# Patient Record
Sex: Male | Born: 2000 | Race: Black or African American | Hispanic: No | Marital: Single | Smoking: Never smoker
Health system: Southern US, Community
[De-identification: ages and names within clinical notes are randomized; demographics above are authoritative.]

## PROBLEM LIST (undated history)

## (undated) DIAGNOSIS — B354 Tinea corporis: Secondary | ICD-10-CM

## (undated) DIAGNOSIS — D571 Sickle-cell disease without crisis: Secondary | ICD-10-CM

## (undated) DIAGNOSIS — D5700 Hb-SS disease with crisis, unspecified: Secondary | ICD-10-CM

## (undated) DIAGNOSIS — Z91010 Allergy to peanuts: Secondary | ICD-10-CM

## (undated) HISTORY — DX: Hb-SS disease with crisis, unspecified: D57.00

## (undated) HISTORY — DX: Tinea corporis: B35.4

## (undated) HISTORY — DX: Sickle-cell disease without crisis: D57.1

## (undated) HISTORY — PX: PORT-A-CATH REMOVAL: SHX5289

## (undated) HISTORY — PX: LIMBAL STEM CELL TRANSPLANT: SHX1969

## (undated) HISTORY — DX: Allergy to peanuts: Z91.010

---
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
## 2022-08-13 ENCOUNTER — Ambulatory Visit
Admission: EM | Admit: 2022-08-13 | Discharge: 2022-08-13 | Disposition: A | Discharge status: Home / Self Care | Payer: BC Managed Care – PPO | Attending: Physician Assistant | Admitting: Physician Assistant

## 2022-08-13 DIAGNOSIS — H5711 Ocular pain, right eye: Secondary | ICD-10-CM

## 2022-08-13 DIAGNOSIS — T1591XA Foreign body on external eye, part unspecified, right eye, initial encounter: Secondary | ICD-10-CM

## 2022-08-13 MED ORDER — POLYMYXIN B-TRIMETHOPRIM 10000-0.1 UNIT/ML-% OP SOLN: 1.0000 [drp] | OPHTHALMIC | 0 refills | Status: AC

## 2022-08-13 NOTE — ED Provider Notes (Signed)
[facility]    CSN: [id number] Arrival date & time: 08/13/22  1756      History   Chief Complaint Chief Complaint  Patient presents with   object in eye    HPI [name] is a 22 y.o. male.   22 year old male presents with right eye pain.  Patient indicates that he believes he got some debris in his right eye yesterday.  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  Patient indicates he does not recall getting anything in the eye but he has had discomfort and the feeling that there is something in the eye.  He indicates that he feels like it is on the upper right inside of the eye just below the lid, he has irrigated the area several times without improvement of his symptoms.  He indicates that the vision is normal but he does get some intermittent blurriness of the vision.  He is without fever or chills.  He does not have any headaches.     Past Medical History:  Diagnosis Date   PEANUT ALLERGY 11/25/2008   Sickle cell anemia    SICKLE CELL ANEMIA 11/25/2008   Receives blood transfusion every 3 weeks in clinical trial for SSD at [facility] 12/06/2008   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/12/2009    Patient Active Problem List   Diagnosis Date Noted   Acute appendicitis 10/24/2018   Appendicitis, acute 10/24/2018   SICKLE CELL CRISIS 12/06/2008   SICKLE CELL ANEMIA 11/25/2008   PEANUT ALLERGY 11/25/2008    Past Surgical History:  Procedure Laterality Date   LAPAROSCOPIC APPENDECTOMY N/A 10/24/2018   Procedure: APPENDECTOMY LAPAROSCOPIC;  Surgeon: Gerald Stabs, MD;  Location: [place];  Service: Pediatrics;  Laterality: N/A;   LIMBAL STEM CELL TRANSPLANT     PORT-A-CATH REMOVAL         Home Medications    Prior to Admission medications   Medication Sig Start Date End Date Taking? Authorizing Provider  trimethoprim-polymyxin b (POLYTRIM) ophthalmic solution Place 1 drop into the right eye every  4 (four) hours. 08/13/22  Yes Nyoka Lint, PA-C  acetaminophen (TYLENOL) 325 MG tablet Take 650 mg by mouth every 6 (six) hours as needed for mild pain.    [provider]  ibuprofen (ADVIL) 200 MG tablet Take 200 mg by mouth every 6 (six) hours as needed for moderate pain.    [provider]  ondansetron (ZOFRAN ODT) 4 MG disintegrating tablet Take 1 tablet (4 mg total) by mouth every 8 (eight) hours as needed for nausea or vomiting. 08/27/20   Merlyn Lot, MD    Family History History reviewed. No pertinent family history.  Social History Social History   Tobacco Use   Smoking status: Never   Smokeless tobacco: Never  Vaping Use   Vaping Use: Never used  Substance Use Topics   Alcohol use: No   Drug use: No     Allergies   Benadryl [diphenhydramine], Diphenhydramine hcl, and Peanut-containing drug products   Review of Systems Review of Systems  Eyes:  Positive for pain (right eye).     Physical Exam Triage Vital Signs ED Triage Vitals  Enc Vitals Group     BP 08/13/22 1815 138/87     Pulse Rate 08/13/22 1815 66     Resp 08/13/22 1815 16     Temp 08/13/22 1815 98 F (36.7 C)  Temp Source 08/13/22 1815 Oral     SpO2 08/13/22 1815 98 %     Weight --      Height --      Head Circumference --      Peak Flow --      Pain Score 08/13/22 1816 6     Pain Loc --      Pain Edu? --      Excl. in [place]? --    No data found.  Updated Vital Signs BP 138/87 (BP Location: Left Arm)   Pulse 66   Temp 98 F (36.7 C) (Oral)   Resp 16   SpO2 98%   Visual Acuity Right Eye Distance:   Left Eye Distance:   Bilateral Distance:    Right Eye Near:   Left Eye Near:    Bilateral Near:     Physical Exam Constitutional:      Appearance: Normal appearance.  Eyes:     General: Lids are normal. Lids are everted, no foreign bodies appreciated.        Right eye: No foreign body or discharge.     Extraocular Movements: Extraocular movements intact.      Comments: Flora stain: There is no evidence of foreign body, or corneal abrasion on exam.  Neurological:     Mental Status: He is alert.      UC Treatments / Results  Labs (all labs ordered are listed, but only abnormal results are displayed) Labs Reviewed - No data to display  EKG   Radiology No results found.  Procedures Procedures (including critical care time)  Medications Ordered in UC Medications - No data to display  Initial Impression / Assessment and Plan / UC Course  I have reviewed the triage vital signs and the nursing notes.  Pertinent labs & imaging results that were available during my care of the patient were reviewed by me and considered in my medical decision making (see chart for details).    Plan: The diagnosis to be treated with the following: 1.  Foreign body right eye: A.  No evidence of foreign body under normal exam and understanding. 2.  Acute pain of the right eye: A.  Polytrim ophthalmic solution, 1 drop every 6 hours to help treat irritation. 3.  Patient advised that if symptoms does not resolve over the next 72 hours or if his symptoms worsen he is to contact [facility] ophthalmology for an appointment and evaluation. 4.  Advised follow-up PCP return to urgent care as needed. Final Clinical Impressions(s) / UC Diagnoses   Final diagnoses:  Acute foreign body of right eye, initial encounter  Acute pain in right eye     Discharge Instructions      Advise use of Polytrim eyedrops, 1 drop in the eye every 4-6 hours on a regular basis for the next couple days until condition clears.  Advised condition fails to improve over the next 48-72 hours or if the pain and discomfort becomes worse then advised to call [facility] ophthalmology for an appointment to be evaluated.  Advised follow-up PCP return to urgent care as needed.    ED Prescriptions     Medication Sig Dispense Auth. Provider   trimethoprim-polymyxin b (POLYTRIM) ophthalmic  solution Place 1 drop into the right eye every 4 (four) hours. 10 mL Nyoka Lint, PA-C      PDMP not reviewed this encounter.   Nyoka Lint, PA-C 08/13/22 Bosie Helper

## 2022-08-13 NOTE — ED Triage Notes (Signed)
Pt c/o something in the right eye first noticed this morning

## 2022-08-13 NOTE — Discharge Instructions (Signed)
Advise use of Polytrim eyedrops, 1 drop in the eye every 4-6 hours on a regular basis for the next couple days until condition clears.  Advised condition fails to improve over the next 48-72 hours or if the pain and discomfort becomes worse then advised to call [facility] ophthalmology for an appointment to be evaluated.  Advised follow-up PCP return to urgent care as needed.
